# Patient Record
Sex: Female | Born: 1935 | Race: White | Hispanic: No
Health system: Southern US, Community
[De-identification: ages and names within clinical notes are randomized; demographics above are authoritative.]

## PROBLEM LIST (undated history)

## (undated) DIAGNOSIS — Z923 Personal history of irradiation: Secondary | ICD-10-CM

---

## 1997-12-04 ENCOUNTER — Other Ambulatory Visit: Admission: RE | Admit: 1997-12-04 | Discharge: 1997-12-04 | Payer: Self-pay | Admitting: Gynecology

## 1999-01-07 ENCOUNTER — Other Ambulatory Visit: Admission: RE | Admit: 1999-01-07 | Discharge: 1999-01-07 | Payer: Self-pay | Admitting: Gynecology

## 2000-12-17 ENCOUNTER — Encounter: Payer: Self-pay | Admitting: Family Medicine

## 2000-12-17 ENCOUNTER — Encounter: Admission: RE | Admit: 2000-12-17 | Discharge: 2000-12-17 | Payer: Self-pay | Admitting: Family Medicine

## 2001-01-12 ENCOUNTER — Other Ambulatory Visit: Admission: RE | Admit: 2001-01-12 | Discharge: 2001-01-12 | Payer: Self-pay | Admitting: Gynecology

## 2001-02-09 ENCOUNTER — Encounter: Payer: Self-pay | Admitting: Gynecology

## 2001-02-09 ENCOUNTER — Encounter: Admission: RE | Admit: 2001-02-09 | Discharge: 2001-02-09 | Payer: Self-pay | Admitting: Gynecology

## 2003-06-01 ENCOUNTER — Other Ambulatory Visit: Admission: RE | Admit: 2003-06-01 | Discharge: 2003-06-01 | Payer: Self-pay | Admitting: Gynecology

## 2003-09-12 ENCOUNTER — Encounter: Admission: RE | Admit: 2003-09-12 | Discharge: 2003-09-12 | Payer: Self-pay | Admitting: Family Medicine

## 2003-09-19 ENCOUNTER — Encounter: Admission: RE | Admit: 2003-09-19 | Discharge: 2003-09-19 | Payer: Self-pay | Admitting: Family Medicine

## 2003-09-22 ENCOUNTER — Encounter: Admission: RE | Admit: 2003-09-22 | Discharge: 2003-09-22 | Payer: Self-pay | Admitting: Family Medicine

## 2003-09-22 ENCOUNTER — Encounter (INDEPENDENT_AMBULATORY_CARE_PROVIDER_SITE_OTHER): Payer: Self-pay | Admitting: Specialist

## 2003-10-09 ENCOUNTER — Encounter (HOSPITAL_COMMUNITY): Admission: RE | Admit: 2003-10-09 | Discharge: 2004-01-07 | Payer: Self-pay | Admitting: General Surgery

## 2003-10-11 ENCOUNTER — Encounter: Admission: RE | Admit: 2003-10-11 | Discharge: 2003-10-11 | Payer: Self-pay | Admitting: General Surgery

## 2003-10-12 ENCOUNTER — Ambulatory Visit (HOSPITAL_COMMUNITY): Admission: RE | Admit: 2003-10-12 | Discharge: 2003-10-12 | Payer: Self-pay | Admitting: General Surgery

## 2003-10-12 ENCOUNTER — Ambulatory Visit (HOSPITAL_BASED_OUTPATIENT_CLINIC_OR_DEPARTMENT_OTHER): Admission: RE | Admit: 2003-10-12 | Discharge: 2003-10-12 | Payer: Self-pay | Admitting: General Surgery

## 2003-10-12 ENCOUNTER — Encounter (INDEPENDENT_AMBULATORY_CARE_PROVIDER_SITE_OTHER): Payer: Self-pay | Admitting: Specialist

## 2003-10-30 ENCOUNTER — Ambulatory Visit: Admission: RE | Admit: 2003-10-30 | Discharge: 2003-12-29 | Payer: Self-pay | Admitting: *Deleted

## 2003-11-07 ENCOUNTER — Ambulatory Visit (HOSPITAL_COMMUNITY): Admission: RE | Admit: 2003-11-07 | Discharge: 2003-11-07 | Payer: Self-pay | Admitting: Oncology

## 2003-11-09 ENCOUNTER — Ambulatory Visit (HOSPITAL_COMMUNITY): Admission: RE | Admit: 2003-11-09 | Discharge: 2003-11-09 | Payer: Self-pay | Admitting: Oncology

## 2003-11-29 ENCOUNTER — Encounter: Admission: RE | Admit: 2003-11-29 | Discharge: 2003-11-29 | Payer: Self-pay | Admitting: Oncology

## 2004-01-09 ENCOUNTER — Ambulatory Visit: Admission: RE | Admit: 2004-01-09 | Discharge: 2004-01-09 | Payer: Self-pay | Admitting: *Deleted

## 2004-06-14 ENCOUNTER — Encounter: Admission: RE | Admit: 2004-06-14 | Discharge: 2004-06-14 | Payer: Self-pay | Admitting: General Surgery

## 2004-07-28 ENCOUNTER — Ambulatory Visit: Payer: Self-pay | Admitting: Oncology

## 2004-09-13 ENCOUNTER — Encounter: Admission: RE | Admit: 2004-09-13 | Discharge: 2004-09-13 | Payer: Self-pay | Admitting: Oncology

## 2005-01-24 ENCOUNTER — Ambulatory Visit: Payer: Self-pay | Admitting: Oncology

## 2005-07-18 ENCOUNTER — Ambulatory Visit: Payer: Self-pay | Admitting: Oncology

## 2005-09-15 ENCOUNTER — Encounter: Admission: RE | Admit: 2005-09-15 | Discharge: 2005-09-15 | Payer: Self-pay | Admitting: Oncology

## 2006-01-15 ENCOUNTER — Ambulatory Visit: Payer: Self-pay | Admitting: Oncology

## 2006-01-19 LAB — CBC WITH DIFFERENTIAL/PLATELET
Basophils Absolute: 0 10*3/uL (ref 0.0–0.1)
EOS%: 2.3 % (ref 0.0–7.0)
HGB: 14.5 g/dL (ref 11.6–15.9)
LYMPH%: 30.6 % (ref 14.0–48.0)
MCH: 29.5 pg (ref 26.0–34.0)
MCV: 85.8 fL (ref 81.0–101.0)
MONO%: 9.7 % (ref 0.0–13.0)
NEUT%: 57.1 % (ref 39.6–76.8)
Platelets: 229 10*3/uL (ref 145–400)
RDW: 13.9 % (ref 11.3–14.5)

## 2006-01-19 LAB — COMPREHENSIVE METABOLIC PANEL
AST: 20 U/L (ref 0–37)
Alkaline Phosphatase: 71 U/L (ref 39–117)
BUN: 19 mg/dL (ref 6–23)
Creatinine, Ser: 0.9 mg/dL (ref 0.4–1.2)
Glucose, Bld: 97 mg/dL (ref 70–99)
Potassium: 4.5 mEq/L (ref 3.5–5.3)
Total Bilirubin: 0.7 mg/dL (ref 0.3–1.2)

## 2006-07-21 ENCOUNTER — Ambulatory Visit: Payer: Self-pay | Admitting: Oncology

## 2006-07-23 LAB — CBC WITH DIFFERENTIAL/PLATELET
Basophils Absolute: 0 10*3/uL (ref 0.0–0.1)
EOS%: 3.3 % (ref 0.0–7.0)
HGB: 14.9 g/dL (ref 11.6–15.9)
MCH: 30.2 pg (ref 26.0–34.0)
NEUT#: 3.6 10*3/uL (ref 1.5–6.5)
RDW: 13.1 % (ref 11.3–14.5)
lymph#: 1.4 10*3/uL (ref 0.9–3.3)

## 2006-07-23 LAB — COMPREHENSIVE METABOLIC PANEL
ALT: 17 U/L (ref 0–35)
AST: 21 U/L (ref 0–37)
Alkaline Phosphatase: 69 U/L (ref 39–117)
Calcium: 9.8 mg/dL (ref 8.4–10.5)
Chloride: 107 mEq/L (ref 96–112)
Creatinine, Ser: 0.91 mg/dL (ref 0.40–1.20)
Potassium: 4.6 mEq/L (ref 3.5–5.3)

## 2006-07-23 LAB — CANCER ANTIGEN 27.29: CA 27.29: 13 U/mL (ref 0–39)

## 2007-01-19 ENCOUNTER — Ambulatory Visit: Payer: Self-pay | Admitting: Oncology

## 2007-01-21 LAB — CBC WITH DIFFERENTIAL/PLATELET
Basophils Absolute: 0 10*3/uL (ref 0.0–0.1)
Eosinophils Absolute: 0.2 10*3/uL (ref 0.0–0.5)
HCT: 41.7 % (ref 34.8–46.6)
HGB: 14.6 g/dL (ref 11.6–15.9)
LYMPH%: 22.6 % (ref 14.0–48.0)
MCV: 84.1 fL (ref 81.0–101.0)
MONO%: 6.4 % (ref 0.0–13.0)
NEUT#: 4.3 10*3/uL (ref 1.5–6.5)
NEUT%: 67.2 % (ref 39.6–76.8)
Platelets: 219 10*3/uL (ref 145–400)
RDW: 13.5 % (ref 11.3–14.5)

## 2007-01-21 LAB — COMPREHENSIVE METABOLIC PANEL
Albumin: 4 g/dL (ref 3.5–5.2)
Alkaline Phosphatase: 64 U/L (ref 39–117)
BUN: 21 mg/dL (ref 6–23)
Glucose, Bld: 84 mg/dL (ref 70–99)
Potassium: 4.4 mEq/L (ref 3.5–5.3)

## 2007-02-22 ENCOUNTER — Encounter: Admission: RE | Admit: 2007-02-22 | Discharge: 2007-02-22 | Payer: Self-pay | Admitting: Oncology

## 2007-02-24 ENCOUNTER — Encounter: Admission: RE | Admit: 2007-02-24 | Discharge: 2007-02-24 | Payer: Self-pay | Admitting: Oncology

## 2007-07-13 ENCOUNTER — Ambulatory Visit: Payer: Self-pay | Admitting: Oncology

## 2007-07-15 LAB — CBC WITH DIFFERENTIAL/PLATELET
Eosinophils Absolute: 0.3 10*3/uL (ref 0.0–0.5)
LYMPH%: 32.8 % (ref 14.0–48.0)
MONO#: 0.4 10*3/uL (ref 0.1–0.9)
NEUT#: 3 10*3/uL (ref 1.5–6.5)
Platelets: 238 10*3/uL (ref 145–400)
RBC: 4.99 10*6/uL (ref 3.70–5.32)
RDW: 12.9 % (ref 11.3–14.5)
WBC: 5.6 10*3/uL (ref 3.9–10.0)

## 2007-07-15 LAB — COMPREHENSIVE METABOLIC PANEL
Albumin: 4.1 g/dL (ref 3.5–5.2)
CO2: 25 mEq/L (ref 19–32)
Calcium: 9.7 mg/dL (ref 8.4–10.5)
Chloride: 106 mEq/L (ref 96–112)
Glucose, Bld: 88 mg/dL (ref 70–99)
Potassium: 4.7 mEq/L (ref 3.5–5.3)
Sodium: 142 mEq/L (ref 135–145)
Total Protein: 7.2 g/dL (ref 6.0–8.3)

## 2007-07-15 LAB — LACTATE DEHYDROGENASE: LDH: 207 U/L (ref 94–250)

## 2007-09-20 ENCOUNTER — Encounter: Admission: RE | Admit: 2007-09-20 | Discharge: 2007-09-20 | Payer: Self-pay | Admitting: Oncology

## 2007-12-24 ENCOUNTER — Encounter (INDEPENDENT_AMBULATORY_CARE_PROVIDER_SITE_OTHER): Payer: Self-pay | Admitting: Gynecology

## 2007-12-24 ENCOUNTER — Ambulatory Visit (HOSPITAL_BASED_OUTPATIENT_CLINIC_OR_DEPARTMENT_OTHER): Admission: RE | Admit: 2007-12-24 | Discharge: 2007-12-24 | Payer: Self-pay | Admitting: Gynecology

## 2008-01-10 ENCOUNTER — Ambulatory Visit: Payer: Self-pay | Admitting: Oncology

## 2008-01-18 LAB — CBC WITH DIFFERENTIAL/PLATELET
Basophils Absolute: 0.1 10*3/uL (ref 0.0–0.1)
Eosinophils Absolute: 0.1 10*3/uL (ref 0.0–0.5)
HCT: 41.2 % (ref 34.8–46.6)
HGB: 14.4 g/dL (ref 11.6–15.9)
LYMPH%: 25.5 % (ref 14.0–48.0)
MCV: 84.1 fL (ref 81.0–101.0)
MONO#: 0.7 10*3/uL (ref 0.1–0.9)
MONO%: 8.8 % (ref 0.0–13.0)
NEUT#: 5.4 10*3/uL (ref 1.5–6.5)
Platelets: 219 10*3/uL (ref 145–400)
WBC: 8.5 10*3/uL (ref 3.9–10.0)

## 2008-01-19 LAB — COMPREHENSIVE METABOLIC PANEL
Albumin: 3.9 g/dL (ref 3.5–5.2)
Alkaline Phosphatase: 57 U/L (ref 39–117)
BUN: 15 mg/dL (ref 6–23)
Creatinine, Ser: 0.76 mg/dL (ref 0.40–1.20)
Glucose, Bld: 83 mg/dL (ref 70–99)
Total Bilirubin: 0.5 mg/dL (ref 0.3–1.2)

## 2008-01-19 LAB — VITAMIN D 25 HYDROXY (VIT D DEFICIENCY, FRACTURES): Vit D, 25-Hydroxy: 23 ng/mL — ABNORMAL LOW (ref 30–89)

## 2008-01-19 LAB — CANCER ANTIGEN 27.29: CA 27.29: 13 U/mL (ref 0–39)

## 2008-03-20 ENCOUNTER — Encounter: Admission: RE | Admit: 2008-03-20 | Discharge: 2008-03-20 | Payer: Self-pay | Admitting: Oncology

## 2008-07-18 ENCOUNTER — Ambulatory Visit: Payer: Self-pay | Admitting: Oncology

## 2008-07-18 LAB — CBC WITH DIFFERENTIAL/PLATELET
BASO%: 0.5 % (ref 0.0–2.0)
EOS%: 4.9 % (ref 0.0–7.0)
HCT: 41.6 % (ref 34.8–46.6)
LYMPH%: 30 % (ref 14.0–48.0)
MCH: 29.8 pg (ref 26.0–34.0)
MCHC: 34.7 g/dL (ref 32.0–36.0)
MONO#: 0.4 10*3/uL (ref 0.1–0.9)
NEUT%: 55.8 % (ref 39.6–76.8)
Platelets: 184 10*3/uL (ref 145–400)
RBC: 4.85 10*6/uL (ref 3.70–5.32)
WBC: 4.7 10*3/uL (ref 3.9–10.0)

## 2008-07-19 LAB — COMPREHENSIVE METABOLIC PANEL
ALT: 20 U/L (ref 0–35)
AST: 24 U/L (ref 0–37)
Alkaline Phosphatase: 62 U/L (ref 39–117)
Calcium: 8.9 mg/dL (ref 8.4–10.5)
Chloride: 109 mEq/L (ref 96–112)
Creatinine, Ser: 0.86 mg/dL (ref 0.40–1.20)
Total Bilirubin: 0.7 mg/dL (ref 0.3–1.2)

## 2008-07-19 LAB — CANCER ANTIGEN 27.29: CA 27.29: 15 U/mL (ref 0–39)

## 2009-01-12 ENCOUNTER — Ambulatory Visit: Payer: Self-pay | Admitting: Oncology

## 2009-01-16 LAB — CBC WITH DIFFERENTIAL/PLATELET
Basophils Absolute: 0 10*3/uL (ref 0.0–0.1)
EOS%: 1.8 % (ref 0.0–7.0)
HCT: 44.1 % (ref 34.8–46.6)
HGB: 15.3 g/dL (ref 11.6–15.9)
MONO#: 0.5 10*3/uL (ref 0.1–0.9)
NEUT#: 3.7 10*3/uL (ref 1.5–6.5)
NEUT%: 62.8 % (ref 38.4–76.8)
RDW: 13.6 % (ref 11.2–14.5)
WBC: 6 10*3/uL (ref 3.9–10.3)
lymph#: 1.6 10*3/uL (ref 0.9–3.3)

## 2009-01-17 LAB — LACTATE DEHYDROGENASE: LDH: 187 U/L (ref 94–250)

## 2009-01-17 LAB — COMPREHENSIVE METABOLIC PANEL
ALT: 16 U/L (ref 0–35)
AST: 22 U/L (ref 0–37)
Albumin: 4.2 g/dL (ref 3.5–5.2)
BUN: 20 mg/dL (ref 6–23)
CO2: 26 mEq/L (ref 19–32)
Calcium: 9.7 mg/dL (ref 8.4–10.5)
Chloride: 103 mEq/L (ref 96–112)
Creatinine, Ser: 0.88 mg/dL (ref 0.40–1.20)
Potassium: 4.4 mEq/L (ref 3.5–5.3)

## 2009-01-17 LAB — VITAMIN D 25 HYDROXY (VIT D DEFICIENCY, FRACTURES): Vit D, 25-Hydroxy: 42 ng/mL (ref 30–89)

## 2009-03-21 ENCOUNTER — Encounter: Admission: RE | Admit: 2009-03-21 | Discharge: 2009-03-21 | Payer: Self-pay | Admitting: Oncology

## 2009-09-17 ENCOUNTER — Encounter: Admission: RE | Admit: 2009-09-17 | Discharge: 2009-09-17 | Payer: Self-pay | Admitting: Oncology

## 2010-01-15 ENCOUNTER — Ambulatory Visit: Payer: Self-pay | Admitting: Oncology

## 2010-01-16 LAB — CBC WITH DIFFERENTIAL/PLATELET
Basophils Absolute: 0 10*3/uL (ref 0.0–0.1)
EOS%: 3.7 % (ref 0.0–7.0)
Eosinophils Absolute: 0.2 10*3/uL (ref 0.0–0.5)
LYMPH%: 30.2 % (ref 14.0–49.7)
MCH: 29.6 pg (ref 25.1–34.0)
MCV: 86.2 fL (ref 79.5–101.0)
MONO%: 8.4 % (ref 0.0–14.0)
NEUT#: 3.3 10*3/uL (ref 1.5–6.5)
Platelets: 199 10*3/uL (ref 145–400)
RBC: 4.82 10*6/uL (ref 3.70–5.45)

## 2010-01-16 LAB — COMPREHENSIVE METABOLIC PANEL
Alkaline Phosphatase: 63 U/L (ref 39–117)
BUN: 18 mg/dL (ref 6–23)
Glucose, Bld: 89 mg/dL (ref 70–99)
Sodium: 142 mEq/L (ref 135–145)
Total Bilirubin: 0.6 mg/dL (ref 0.3–1.2)

## 2010-01-16 LAB — CANCER ANTIGEN 27.29: CA 27.29: 13 U/mL (ref 0–39)

## 2010-03-22 ENCOUNTER — Encounter: Admission: RE | Admit: 2010-03-22 | Discharge: 2010-03-22 | Payer: Self-pay | Admitting: Oncology

## 2010-07-05 ENCOUNTER — Encounter: Admission: RE | Admit: 2010-07-05 | Discharge: 2010-07-05 | Payer: Self-pay | Admitting: Family Medicine

## 2011-01-21 NOTE — Op Note (Signed)
Latoya Hodge, Latoya Hodge                 ACCOUNT NO.:  [id number]   MEDICAL RECORD NO.:  [id number]          PATIENT TYPE:  AMB   LOCATION:  [facility]                         FACILITY:  [facility]   PHYSICIAN:  Gretta Cool, M.D. DATE OF BIRTH:  08-03-1931   DATE OF PROCEDURE:  DATE OF DISCHARGE:                               OPERATIVE REPORT   PREOPERATIVE DIAGNOSES:  1. Endometrial polyp.  2. History of breast cancer with research selective estrogen receptor      modulator drug use.   POSTOPERATIVE DIAGNOSES:  1. Endometrial polyp.  2. History of breast cancer with research selective estrogen receptor      modulator drug use.   PROCEDURE:  Hysteroscopy, resection of multiple large endometrial  polyps, and total endometrial resection for ablation.   SURGEON:  Gretta Cool, M.D.   ANESTHESIA:  General orotracheal.   ANESTHESIA:  IV sedation and paracervical block.   DESCRIPTION OF PROCEDURE:  Under excellent anesthesia with the patient  prepped and draped in Allen stirrups, the cervix was progressively  dilated with a series of Hanks, then Pratt dilators to accommodate th 7-  mm resectoscope.  The resectoscope was then introduced and the  endometrial cavity photographed.  The entire polyp was then resected.  After resecting the large polyp that virtually filled the entire cavity,  other polyps were noted.  There was one in the right cornual area and  other polyps on the anterior wall of the uterus as well.  All of the  polyps were progressively resected and the entire endometrial cavity was  then also resected.  Note, the cavity was very vascular.  The evidence  of endometrial stimulation was quite prominent, indicating that this  SERM has potent effects on the endometrial lining and the myometrium of  uterus and vascularity of the uterus.  Once the entire cavity  was resected, the cavity was then treated by VaporTrode electrode so as  to eliminate any superficial endometrium in  the myometrial tissue.  At  this point with reduced pressure, there was minimal bleeding.  The  procedure was then terminated without complication.  The patient  returned to the recovery room in excellent condition.           ______________________________  Gretta Cool, M.D.     CWL/MEDQ  D:  12/24/2007  T:  12/24/2007  Job:  [id number]   cc:   Pierce Crane, MD  Fax: [phone]   L. Lupe Carney, M.D.  Fax: [phone]

## 2011-01-22 ENCOUNTER — Other Ambulatory Visit: Payer: Self-pay | Admitting: Oncology

## 2011-01-22 ENCOUNTER — Encounter (HOSPITAL_BASED_OUTPATIENT_CLINIC_OR_DEPARTMENT_OTHER): Payer: Medicare Other | Admitting: Oncology

## 2011-01-22 DIAGNOSIS — C50119 Malignant neoplasm of central portion of unspecified female breast: Secondary | ICD-10-CM

## 2011-01-22 DIAGNOSIS — Z171 Estrogen receptor negative status [ER-]: Secondary | ICD-10-CM

## 2011-01-22 LAB — CBC WITH DIFFERENTIAL/PLATELET
EOS%: 2.8 % (ref 0.0–7.0)
HCT: 41.8 % (ref 34.8–46.6)
HGB: 14.3 g/dL (ref 11.6–15.9)
LYMPH%: 31.8 % (ref 14.0–49.7)
MCHC: 34.2 g/dL (ref 31.5–36.0)
MONO%: 8 % (ref 0.0–14.0)
NEUT#: 2.9 10*3/uL (ref 1.5–6.5)
RBC: 4.87 10*6/uL (ref 3.70–5.45)
RDW: 13.5 % (ref 11.2–14.5)
WBC: 5 10*3/uL (ref 3.9–10.3)

## 2011-01-23 LAB — COMPREHENSIVE METABOLIC PANEL
ALT: 15 U/L (ref 0–35)
Alkaline Phosphatase: 48 U/L (ref 39–117)
Potassium: 4.5 mEq/L (ref 3.5–5.3)
Sodium: 140 mEq/L (ref 135–145)
Total Bilirubin: 0.6 mg/dL (ref 0.3–1.2)
Total Protein: 6.3 g/dL (ref 6.0–8.3)

## 2011-01-24 ENCOUNTER — Other Ambulatory Visit: Payer: Self-pay | Admitting: Oncology

## 2011-01-24 ENCOUNTER — Encounter (HOSPITAL_BASED_OUTPATIENT_CLINIC_OR_DEPARTMENT_OTHER): Payer: Medicare Other | Admitting: Oncology

## 2011-01-24 DIAGNOSIS — Z9889 Other specified postprocedural states: Secondary | ICD-10-CM

## 2011-01-24 NOTE — Op Note (Signed)
NAMETRINH, SANJOSE                           ACCOUNT NO.:  [id number]   MEDICAL RECORD NO.:  [id number]                   PATIENT TYPE:  AMB   LOCATION:  [facility]                                  FACILITY:  [facility]   PHYSICIAN:  Rose Phi. Maple Hudson, M.D.                DATE OF BIRTH:  [birth year]   DATE OF PROCEDURE:  10/12/2003  DATE OF DISCHARGE:                                 OPERATIVE REPORT   PREOPERATIVE DIAGNOSIS:  Stage I carcinoma of the right breast.   POSTOPERATIVE DIAGNOSIS:  Stage I carcinoma of the right breast.   PROCEDURE:  1. Blue dye injection.  2. Right sentinel lymph node biopsy.  3. Right partial mastectomy.   SURGEON:  Rose Phi. Maple Hudson, M.D.   ANESTHESIA:  General.   DESCRIPTION OF PROCEDURE:  Prior to coming to the operating room, 1  millicurie of technetium sulfa colloid was injected intradermally.   After suitable general anesthesia was induced, the patient was placed in the  supine position with the arms extended on the arm board.  Then 5 mL of a  mixture of 2 mL of methylene blue and 3 mL of saline was injected in the  subareolar tissue and the breast gently massaged for about 3 minutes.   After prepping and draping a short transverse axillary incision was made  with dissection down to the clavipectoral fascia.  I was unable to identify  any hot or blue lymph nodes.   After careful dissection, we saw an enlarged lymph node which I removed as a  sentinel node because it was in the right position.  There was some other  axillary tissue also removed as a sentinel node because it was in the right  position.  There was some other axillary tissue also removed.  The node was  pretty much fatty replaced, and on touch prep showed no tumor.  I think that  fatty replacement explains the lack of radioactive activity and the blue  dye.   At any rate, while that was being evaluated, the breast was clear as I was  going to have to incorporate the nipple areolar complex in  the excision  because of the subareolar position of the tumor.  So, a transverse  elliptical incision was then outlined and then area incised and then I  excised it widely in the central partial mastectomy.  Hemostasis was  obtained with the cautery.   Touch prep showed the only margin that was even close was at the inferior  deep margin and I excised some more tissue there to make the margin better.  With good hemostasis, the incisions were all closed with 3-0 Vicryl and  subcuticular 4-0 Monocryl and Steri-Strips.  Dressings applied.  The patient  was transferred to the recovery room in satisfactory condition having  tolerated the procedure well.  Rose Phi. Maple Hudson, M.D.    PRY/MEDQ  D:  10/12/2003  T:  10/12/2003  Job:  [id number]

## 2011-03-24 ENCOUNTER — Ambulatory Visit
Admission: RE | Admit: 2011-03-24 | Discharge: 2011-03-24 | Disposition: A | Discharge status: Home / Self Care | Payer: Medicare Other | Source: Ambulatory Visit | Attending: Oncology | Admitting: Oncology

## 2011-03-24 DIAGNOSIS — Z9889 Other specified postprocedural states: Secondary | ICD-10-CM

## 2011-09-17 ENCOUNTER — Other Ambulatory Visit: Payer: Self-pay | Admitting: Family Medicine

## 2011-09-17 ENCOUNTER — Ambulatory Visit
Admission: RE | Admit: 2011-09-17 | Discharge: 2011-09-17 | Disposition: A | Discharge status: Home / Self Care | Payer: Medicare Other | Source: Ambulatory Visit | Attending: Family Medicine | Admitting: Family Medicine

## 2011-09-17 DIAGNOSIS — S0990XA Unspecified injury of head, initial encounter: Secondary | ICD-10-CM

## 2011-09-17 DIAGNOSIS — R42 Dizziness and giddiness: Secondary | ICD-10-CM

## 2011-09-17 DIAGNOSIS — G44009 Cluster headache syndrome, unspecified, not intractable: Secondary | ICD-10-CM

## 2012-04-06 ENCOUNTER — Other Ambulatory Visit: Payer: Self-pay | Admitting: Oncology

## 2012-04-06 DIAGNOSIS — Z1231 Encounter for screening mammogram for malignant neoplasm of breast: Secondary | ICD-10-CM

## 2012-04-23 ENCOUNTER — Ambulatory Visit
Admission: RE | Admit: 2012-04-23 | Discharge: 2012-04-23 | Disposition: A | Discharge status: Home / Self Care | Payer: Medicare Other | Source: Ambulatory Visit | Attending: Oncology | Admitting: Oncology

## 2012-04-23 DIAGNOSIS — Z1231 Encounter for screening mammogram for malignant neoplasm of breast: Secondary | ICD-10-CM

## 2012-04-27 ENCOUNTER — Other Ambulatory Visit: Payer: Self-pay | Admitting: *Deleted

## 2012-04-27 DIAGNOSIS — C50919 Malignant neoplasm of unspecified site of unspecified female breast: Secondary | ICD-10-CM

## 2012-04-28 ENCOUNTER — Other Ambulatory Visit (HOSPITAL_BASED_OUTPATIENT_CLINIC_OR_DEPARTMENT_OTHER): Payer: Medicare Other | Admitting: Lab

## 2012-04-28 DIAGNOSIS — C50919 Malignant neoplasm of unspecified site of unspecified female breast: Secondary | ICD-10-CM

## 2012-04-28 LAB — CBC WITH DIFFERENTIAL/PLATELET
Basophils Absolute: 0 10*3/uL (ref 0.0–0.1)
HCT: 43.1 % (ref 34.8–46.6)
HGB: 14.4 g/dL (ref 11.6–15.9)
LYMPH%: 27.1 % (ref 14.0–49.7)
MCHC: 33.4 g/dL (ref 31.5–36.0)
MONO#: 0.5 10*3/uL (ref 0.1–0.9)
NEUT%: 61.6 % (ref 38.4–76.8)
Platelets: 183 10*3/uL (ref 145–400)
WBC: 5.7 10*3/uL (ref 3.9–10.3)
lymph#: 1.6 10*3/uL (ref 0.9–3.3)

## 2012-05-05 ENCOUNTER — Ambulatory Visit (HOSPITAL_BASED_OUTPATIENT_CLINIC_OR_DEPARTMENT_OTHER): Payer: Medicare Other | Admitting: Oncology

## 2012-05-05 VITALS — BP 113/71 | HR 76 | Temp 97.9°F | Resp 20 | Ht 60.0 in | Wt 172.6 lb

## 2012-05-05 DIAGNOSIS — I872 Venous insufficiency (chronic) (peripheral): Secondary | ICD-10-CM

## 2012-05-05 DIAGNOSIS — M7989 Other specified soft tissue disorders: Secondary | ICD-10-CM

## 2012-05-05 DIAGNOSIS — Z853 Personal history of malignant neoplasm of breast: Secondary | ICD-10-CM

## 2012-05-05 DIAGNOSIS — C50919 Malignant neoplasm of unspecified site of unspecified female breast: Secondary | ICD-10-CM

## 2012-05-05 DIAGNOSIS — Z170 Estrogen receptor positive status [ER+]: Secondary | ICD-10-CM

## 2012-05-05 NOTE — Progress Notes (Signed)
Hematology and Oncology Follow Up Visit  Latoya Hodge [id number] August 01, 1931 78 y.o. 05/05/2012 6:29 PM   DIAGNOSIS:   Encounter Diagnoses  Name Primary?  . Malignant neoplasm of breast (female), unspecified site Yes  . Venous insufficiency      PAST THERAPY:  :  History of T2 N0, ER/PR positive breast cancer, status post lumpectomy and radiation therapy completed 12/21/2003, on MA.27 study on exemestane arm, completing exemestane in April 2010.     Interim History:  Patient returns for followup. She is the having some issues with swelling of her left leg. This is intermittent seems to worsen evenings. Seems to be better in the mornings. She has not had any form of investigations for this. There is no obvious pain associated with this. She has not noticed any discoloration of her lower extremity. She is otherwise doing well. Her most recent mammogram was reviewed and this was within normal limits. Her other medications are stable. Medications:   Allergies: No Known Allergies  Past Medical History, Surgical history, Social history, and Family History were reviewed and updated.  Review of Systems: Constitutional:  Negative for fever, chills, night sweats, anorexia, weight loss, pain. Cardiovascular: no chest pain or dyspnea on exertion Respiratory: negative Neurological: negative Dermatological: negative ENT: negative Skin Gastrointestinal: negative Genito-Urinary: negative Hematological and Lymphatic: negative Breast: negative Musculoskeletal: negative Remaining ROS negative.  Physical Exam:  Blood pressure 113/71, pulse 76, temperature 97.9 F (36.6 C), resp. rate 20, height 5' (1.524 m), weight 172 lb 9.6 oz (78.291 kg).  ECOG: 0   HEENT:  Sclerae anicteric, conjunctivae pink.  Oropharynx clear.  No mucositis or candidiasis.  Nodes:  No cervical, supraclavicular, or axillary lymphadenopathy palpated.  Breast Exam:  Right breast is benign.  No masses, discharge, skin  change, or nipple inversion.  Left breast is benign.  No masses, discharge, skin change, or nipple inversion..  Lungs:  Clear to auscultation bilaterally.  No crackles, rhonchi, or wheezes.  Heart:  Regular rate and rhythm.  Abdomen:  Soft, nontender.  Positive bowel sounds.  No organomegaly or masses palpated.  Musculoskeletal:  No focal spinal tenderness to palpation.  Extremities:  Benign.  No peripheral edema or cyanosis. , Specifically her left lower extremity appears to be normal you know she claims that there is some edema associated with it. There is no calf tenderness no normal is they're obvious demarcation or other stigmata for vascular insufficiency. Skin:  Benign.  Neuro:  Nonfocal.    Lab Results: Lab Results  Component Value Date   WBC 5.7 04/28/2012   HGB 14.4 04/28/2012   HCT 43.1 04/28/2012   MCV 85.5 04/28/2012   PLT 183 04/28/2012     Chemistry      Component Value Date/Time   NA 140 01/22/2011 1118   NA 140 01/22/2011 1118   K 4.5 01/22/2011 1118   K 4.5 01/22/2011 1118   CL 108 01/22/2011 1118   CL 108 01/22/2011 1118   CO2 21 01/22/2011 1118   CO2 21 01/22/2011 1118   BUN 21 01/22/2011 1118   BUN 21 01/22/2011 1118   CREATININE 0.77 01/22/2011 1118   CREATININE 0.77 01/22/2011 1118      Component Value Date/Time   CALCIUM 9.1 01/22/2011 1118   CALCIUM 9.1 01/22/2011 1118   ALKPHOS 48 01/22/2011 1118   ALKPHOS 48 01/22/2011 1118   AST 24 01/22/2011 1118   AST 24 01/22/2011 1118   ALT 15 01/22/2011 1118   ALT 15 01/22/2011  1118   BILITOT 0.6 01/22/2011 1118   BILITOT 0.6 01/22/2011 1118       Radiological Studies:  Mm Digital Screening  04/23/2012  *RADIOLOGY REPORT*  Clinical Data: Screening.  DIGITAL BILATERAL SCREENING MAMMOGRAM WITH CAD  Comparison:  Previous exams.  Findings:  The breast tissue is heterogeneously dense. No suspicious masses, architectural distortion, or calcifications are present.  Images were processed with CAD.  IMPRESSION: No mammographic evidence of  malignancy.  A result letter of this screening mammogram will be mailed directly to the patient.  RECOMMENDATION: Screening mammogram in one year. (Code:SM-B-01Y)  BI-RADS CATEGORY 2:  Benign finding(s).  Original Report Authenticated By: Sherian Rein, M.D.     IMPRESSIONS AND PLAN: A 78 y.o. female with   History of node-negative breast cancers now and if your followup. She is stable. I recommended that she have annual followup with her primary care Dr. and get mammography done as scheduled as well as other health care maintenance. I have taken the opportunity referred to the vascular surgery groups. She may have some varicose vein-type issues that may require some investigation. I have not scheduled her for regular followup and be happy to see her anytime in the future should the need arise.   Spent more than half the time coordinating care, as well as discussion of BMI and its implications.      Copeland Lapier 8/28/20136:29 PM Cell [id number]

## 2013-02-03 ENCOUNTER — Other Ambulatory Visit: Payer: Self-pay

## 2013-02-03 DIAGNOSIS — Z1231 Encounter for screening mammogram for malignant neoplasm of breast: Secondary | ICD-10-CM

## 2013-04-29 ENCOUNTER — Ambulatory Visit
Admission: RE | Admit: 2013-04-29 | Discharge: 2013-04-29 | Disposition: A | Discharge status: Home / Self Care | Payer: Medicare Other | Source: Ambulatory Visit

## 2013-04-29 DIAGNOSIS — Z1231 Encounter for screening mammogram for malignant neoplasm of breast: Secondary | ICD-10-CM

## 2014-01-25 ENCOUNTER — Ambulatory Visit
Admission: RE | Admit: 2014-01-25 | Discharge: 2014-01-25 | Disposition: A | Discharge status: Home / Self Care | Payer: Medicare Other | Source: Ambulatory Visit | Attending: Gastroenterology | Admitting: Gastroenterology

## 2014-01-25 ENCOUNTER — Other Ambulatory Visit: Payer: Self-pay | Admitting: Gastroenterology

## 2014-01-25 DIAGNOSIS — K5901 Slow transit constipation: Secondary | ICD-10-CM

## 2014-04-11 ENCOUNTER — Other Ambulatory Visit: Payer: Self-pay

## 2014-04-11 ENCOUNTER — Other Ambulatory Visit: Payer: Self-pay | Admitting: Family Medicine

## 2014-04-11 DIAGNOSIS — Z1231 Encounter for screening mammogram for malignant neoplasm of breast: Secondary | ICD-10-CM

## 2014-04-11 DIAGNOSIS — Z853 Personal history of malignant neoplasm of breast: Secondary | ICD-10-CM

## 2014-04-11 DIAGNOSIS — Z9889 Other specified postprocedural states: Secondary | ICD-10-CM

## 2014-05-05 ENCOUNTER — Ambulatory Visit
Admission: RE | Admit: 2014-05-05 | Discharge: 2014-05-05 | Disposition: A | Discharge status: Home / Self Care | Payer: Medicare Other | Source: Ambulatory Visit

## 2014-05-05 DIAGNOSIS — Z9889 Other specified postprocedural states: Secondary | ICD-10-CM

## 2014-05-05 DIAGNOSIS — Z853 Personal history of malignant neoplasm of breast: Secondary | ICD-10-CM

## 2014-05-05 DIAGNOSIS — Z1231 Encounter for screening mammogram for malignant neoplasm of breast: Secondary | ICD-10-CM

## 2014-09-06 ENCOUNTER — Other Ambulatory Visit (HOSPITAL_COMMUNITY): Payer: Self-pay | Admitting: Orthopaedic Surgery

## 2014-09-06 DIAGNOSIS — M25551 Pain in right hip: Secondary | ICD-10-CM

## 2014-09-20 ENCOUNTER — Ambulatory Visit (HOSPITAL_COMMUNITY): Admission: RE | Admit: 2014-09-20 | Payer: Medicare Other | Source: Ambulatory Visit

## 2014-09-22 ENCOUNTER — Ambulatory Visit (HOSPITAL_COMMUNITY)
Admission: RE | Admit: 2014-09-22 | Discharge: 2014-09-22 | Disposition: A | Discharge status: Home / Self Care | Payer: Medicare Other | Source: Ambulatory Visit | Attending: Orthopaedic Surgery | Admitting: Orthopaedic Surgery

## 2014-09-22 DIAGNOSIS — R609 Edema, unspecified: Secondary | ICD-10-CM | POA: Diagnosis not present

## 2014-09-22 DIAGNOSIS — M25551 Pain in right hip: Secondary | ICD-10-CM | POA: Insufficient documentation

## 2014-09-22 DIAGNOSIS — Z853 Personal history of malignant neoplasm of breast: Secondary | ICD-10-CM | POA: Insufficient documentation

## 2014-09-22 DIAGNOSIS — M4806 Spinal stenosis, lumbar region: Secondary | ICD-10-CM | POA: Diagnosis not present

## 2015-03-09 ENCOUNTER — Other Ambulatory Visit: Payer: Self-pay | Admitting: Physician Assistant

## 2015-03-09 DIAGNOSIS — R1031 Right lower quadrant pain: Secondary | ICD-10-CM

## 2015-03-15 ENCOUNTER — Ambulatory Visit
Admission: RE | Admit: 2015-03-15 | Discharge: 2015-03-15 | Disposition: A | Discharge status: Home / Self Care | Payer: Medicare Other | Source: Ambulatory Visit | Attending: Physician Assistant | Admitting: Physician Assistant

## 2015-03-15 DIAGNOSIS — R1031 Right lower quadrant pain: Secondary | ICD-10-CM

## 2015-03-15 MED ORDER — IOPAMIDOL (ISOVUE-300) INJECTION 61%
100.0000 mL | Freq: Once | INTRAVENOUS | Status: AC | PRN
  Administered 2015-03-15: 100 mL via INTRAVENOUS

## 2015-08-09 ENCOUNTER — Other Ambulatory Visit: Payer: Self-pay

## 2015-08-09 DIAGNOSIS — Z1231 Encounter for screening mammogram for malignant neoplasm of breast: Secondary | ICD-10-CM

## 2015-08-23 ENCOUNTER — Ambulatory Visit
Admission: RE | Admit: 2015-08-23 | Discharge: 2015-08-23 | Disposition: A | Discharge status: Home / Self Care | Payer: Medicare Other | Source: Ambulatory Visit

## 2015-08-23 DIAGNOSIS — Z1231 Encounter for screening mammogram for malignant neoplasm of breast: Secondary | ICD-10-CM

## 2015-09-18 ENCOUNTER — Other Ambulatory Visit: Payer: Self-pay | Admitting: Physician Assistant

## 2015-09-18 DIAGNOSIS — R1310 Dysphagia, unspecified: Secondary | ICD-10-CM

## 2015-09-24 ENCOUNTER — Ambulatory Visit
Admission: RE | Admit: 2015-09-24 | Discharge: 2015-09-24 | Disposition: A | Discharge status: Home / Self Care | Payer: Medicare Other | Source: Ambulatory Visit | Attending: Physician Assistant | Admitting: Physician Assistant

## 2015-09-24 DIAGNOSIS — R1310 Dysphagia, unspecified: Secondary | ICD-10-CM

## 2016-03-03 IMAGING — RF DG ESOPHAGUS
16 of 24 series · 16 of 24 positions shown · non-contrast
Comparison: None.

CLINICAL DATA: Dysphagia

EXAM:
ESOPHOGRAM/BARIUM SWALLOW
TECHNIQUE: Single contrast examination was performed using  thin barium.
FLUOROSCOPY TIME:  Radiation Exposure Index (as provided by the
fluoroscopic device): 51 deci Gy
If the device does not provide the exposure index:
Fluoroscopy Time:  dictate in minutes and seconds
Number of Acquired Images:

[Series 1: run · 1 of 10 slices shown (1 of 16)]
[im 1/10]
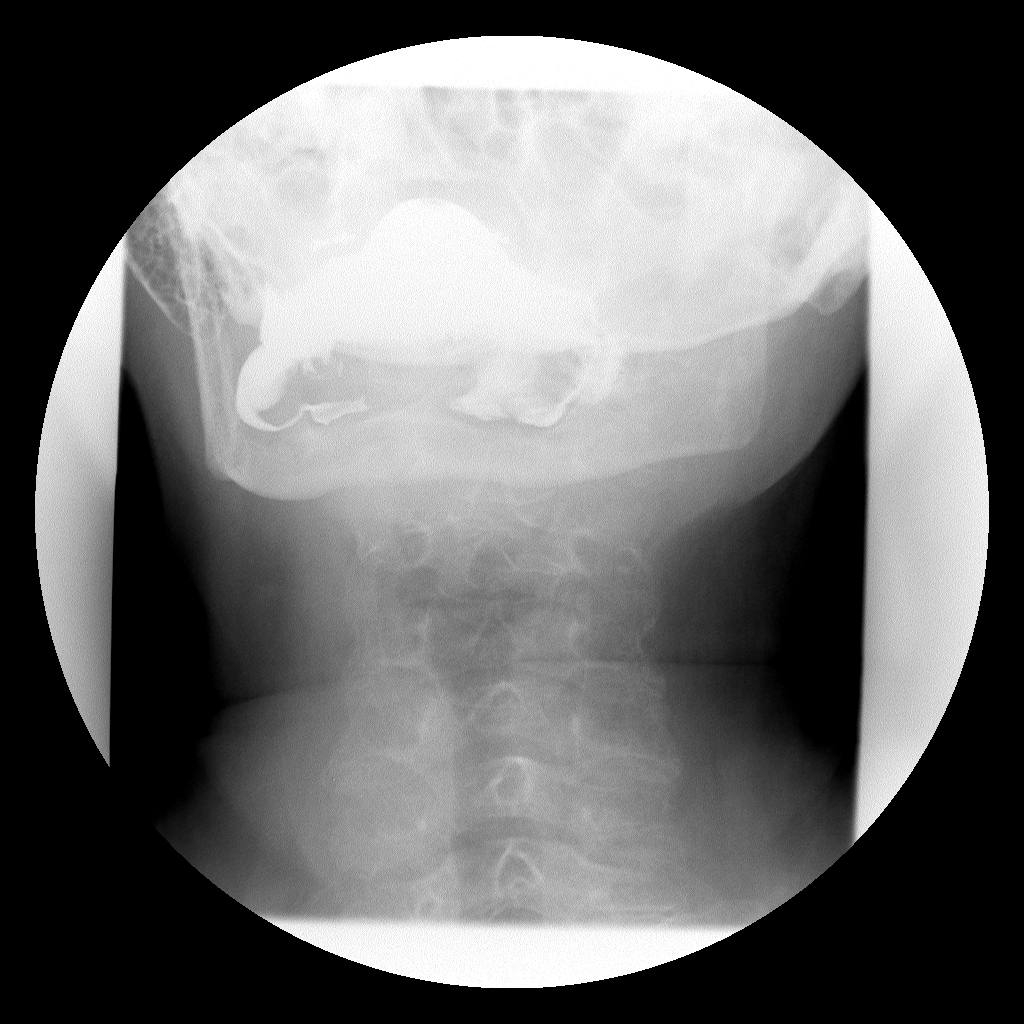

[Series 3: run · 1 of 1 slices shown (2 of 16)]
[im 1/1]
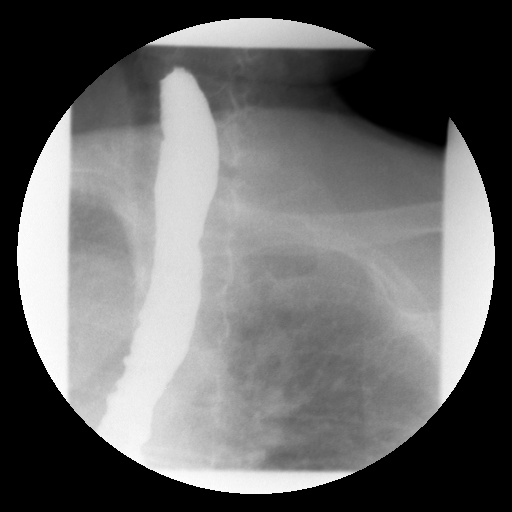

[Series 4: run · 1 of 1 slices shown (3 of 16)]
[im 1/1]
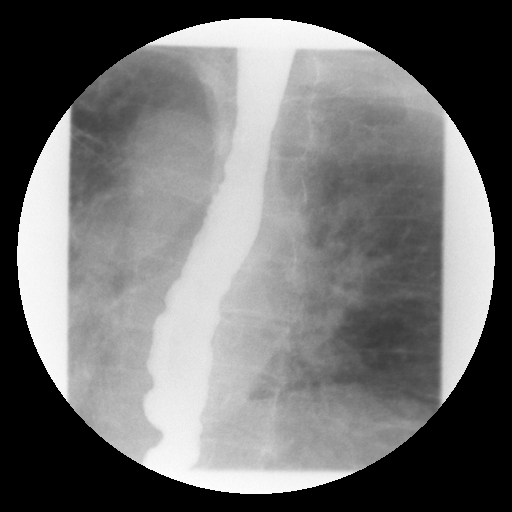

[Series 6: run · 1 of 1 slices shown (4 of 16)]
[im 1/1]
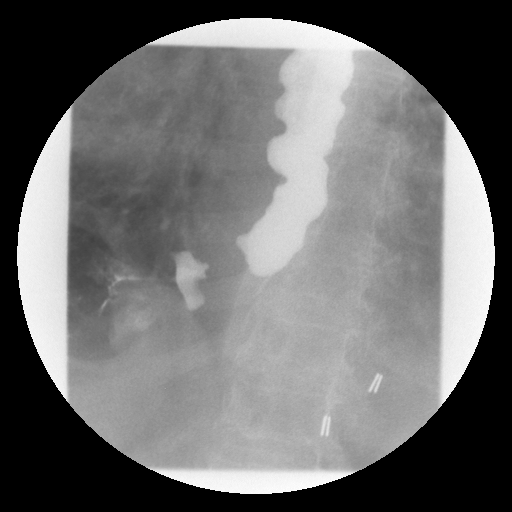

[Series 7: run · 1 of 1 slices shown (5 of 16)]
[im 1/1]
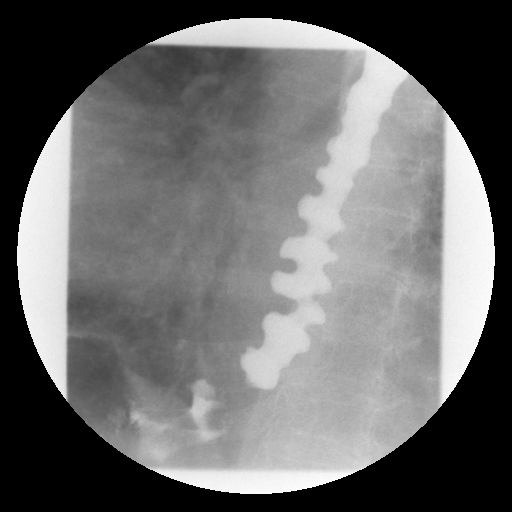

[Series 9: run · 1 of 1 slices shown (6 of 16)]
[im 1/1]
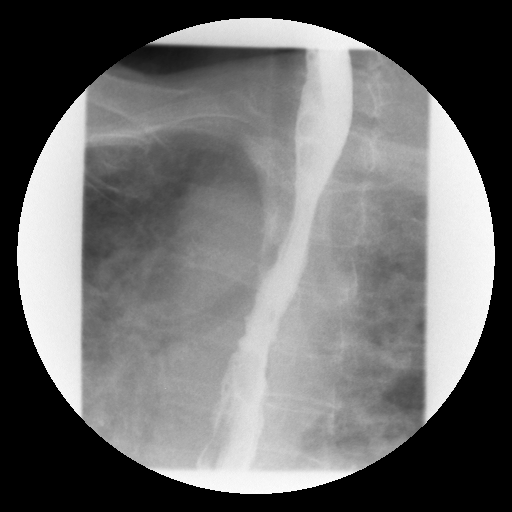

[Series 10: run · 1 of 1 slices shown (7 of 16)]
[im 1/1]
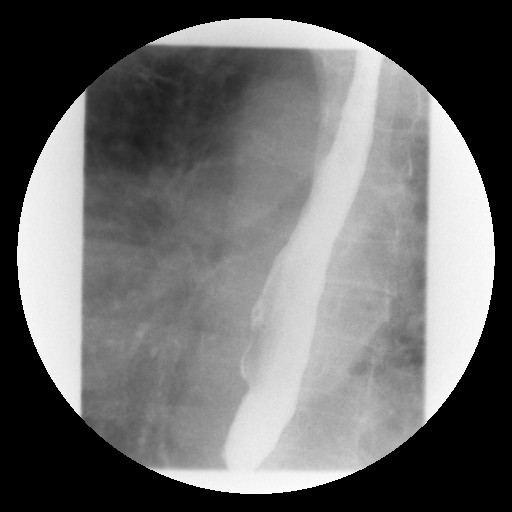

[Series 12: run · 1 of 1 slices shown (8 of 16)]
[im 1/1]
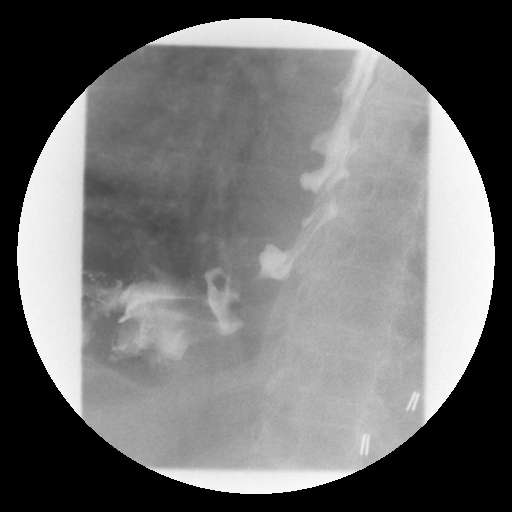

[Series 13: run · 1 of 1 slices shown (9 of 16)]
[im 1/1]
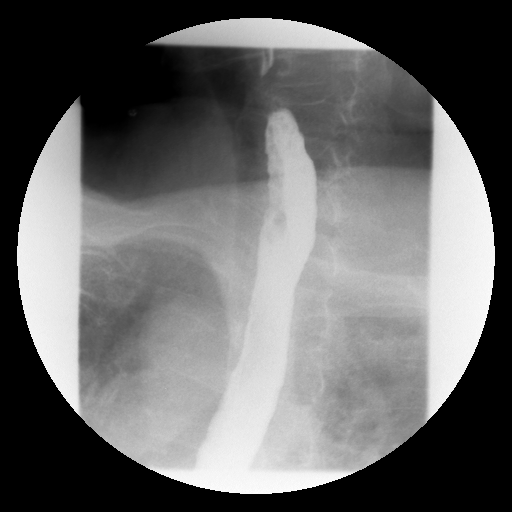

[Series 15: run · 1 of 1 slices shown (10 of 16)]
[im 1/1]
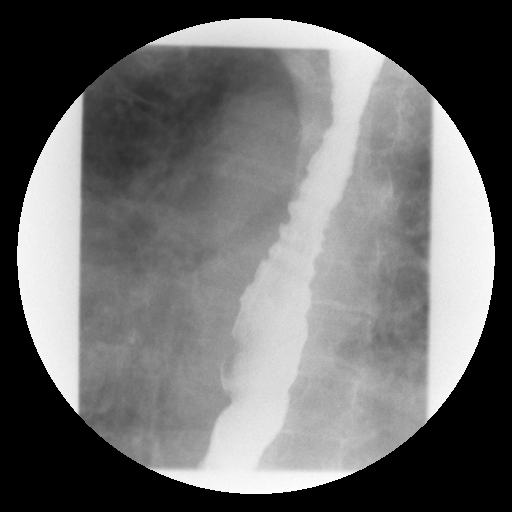

[Series 16: run · 1 of 1 slices shown (11 of 16)]
[im 1/1]
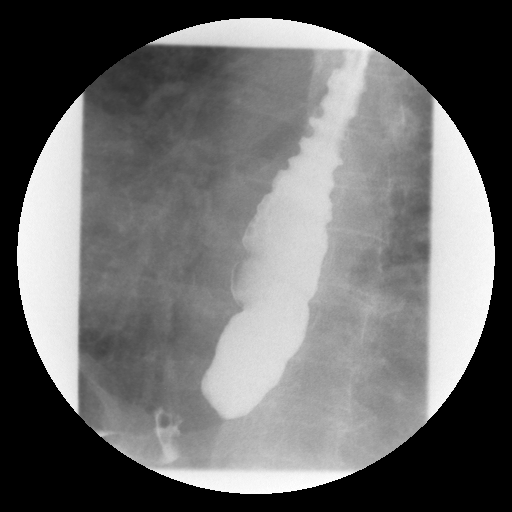

[Series 18: run · 1 of 1 slices shown (12 of 16)]
[im 1/1]
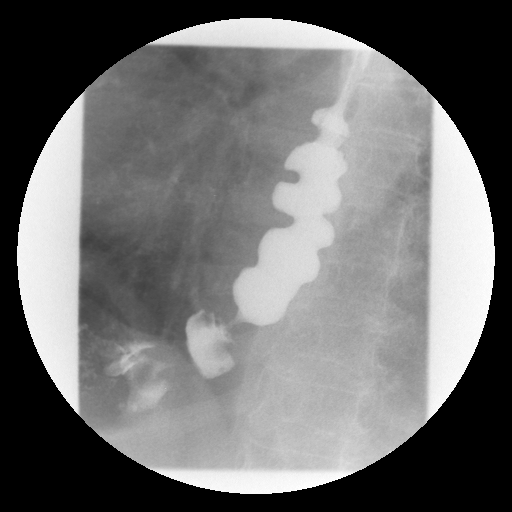

[Series 19: run · 1 of 1 slices shown (13 of 16)]
[im 1/1]
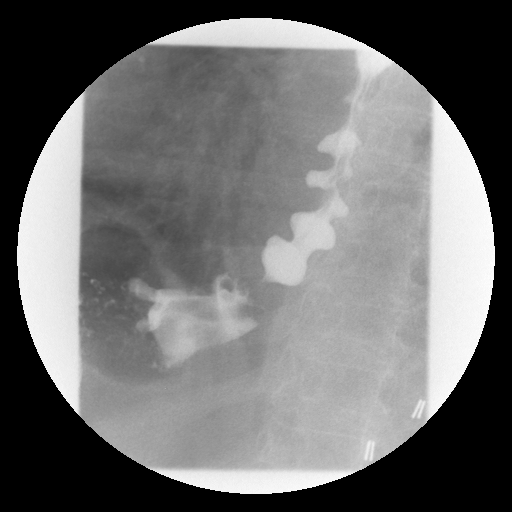

[Series 21: run · 1 of 1 slices shown (14 of 16)]
[im 1/1]
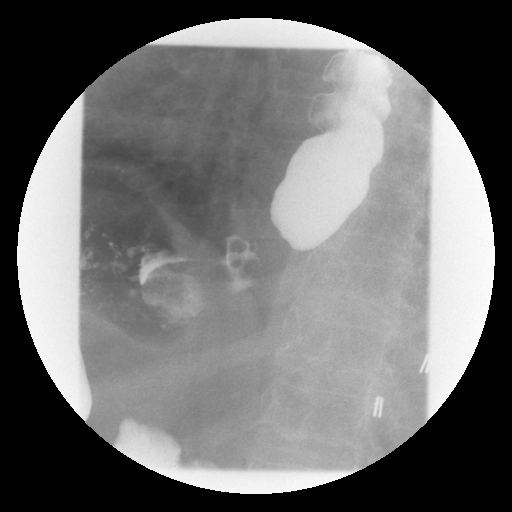

[Series 22: run · 1 of 1 slices shown (15 of 16)]
[im 1/1]
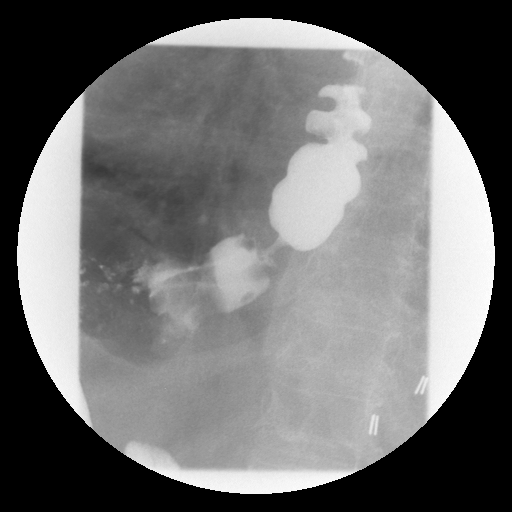

[Series 24: run · 1 of 1 slices shown (16 of 16)]
[im 1/1]
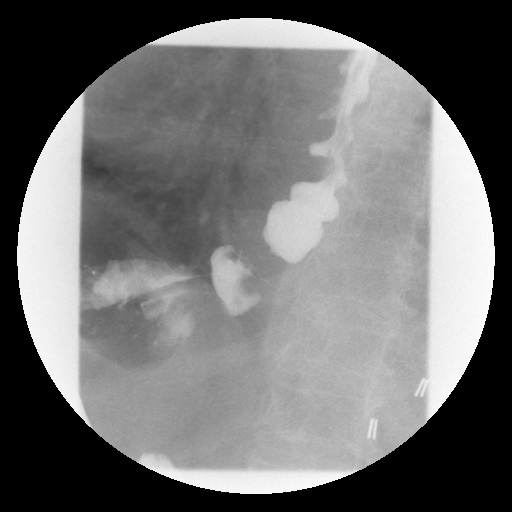

[16 of 24 positions shown; findings below may reference images not displayed]

FINDINGS: The patient is elderly and somewhat infirm, and I elected to perform
a single contrast esophagram.

Pharyngeal phase of swallowing normal. There was disruption [DATE] swallows in the mid thoracic esophagus with tertiary
contractions and a corkscrew esophagus appearance in the distal half
of the esophagus as on series 7.

Small type 1 hiatal hernia. Just above this hiatal hernia, in the
distal esophagus,, there is an approximately 1.1 cm long
circumferential narrowing of the esophagus with maximum enter
diameter of 3-4 mm. Along the hiatal hernia site of this narrowing,
there questionable filling defects as on series 23 and series 31.

A 13 mm barium tablet impacted at this area of narrowing and is
expected to dissolve.
IMPRESSION: 1. Circumferential narrowing of the distal esophagus over a 1.1 cm
segment just above the small type 1 hiatal hernia. There are
surrounding tertiary contractions which confused the appearance, but
I cannot exclude distal esophageal stricture or mass, and endoscopy
is recommended for further assessment.
2. Nonspecific esophageal dysmotility.

## 2016-09-30 ENCOUNTER — Other Ambulatory Visit: Payer: Self-pay | Admitting: Family Medicine

## 2016-09-30 DIAGNOSIS — Z1231 Encounter for screening mammogram for malignant neoplasm of breast: Secondary | ICD-10-CM

## 2016-10-31 ENCOUNTER — Ambulatory Visit
Admission: RE | Admit: 2016-10-31 | Discharge: 2016-10-31 | Disposition: A | Discharge status: Home / Self Care | Payer: Medicare Other | Source: Ambulatory Visit | Attending: Family Medicine | Admitting: Family Medicine

## 2016-10-31 DIAGNOSIS — Z1231 Encounter for screening mammogram for malignant neoplasm of breast: Secondary | ICD-10-CM

## 2016-10-31 HISTORY — DX: Personal history of irradiation: Z92.3

## 2020-06-08 DEATH — deceased
